# Patient Record
Sex: Female | Born: 1995 | Race: White | Hispanic: No | Marital: Single | Smoking: Never smoker
Health system: Southern US, Community
[De-identification: ages and names within clinical notes are randomized; demographics above are authoritative.]

## PROBLEM LIST (undated history)

## (undated) ENCOUNTER — Inpatient Hospital Stay (HOSPITAL_COMMUNITY): Payer: Self-pay

## (undated) DIAGNOSIS — R569 Unspecified convulsions: Secondary | ICD-10-CM

## (undated) DIAGNOSIS — O139 Gestational [pregnancy-induced] hypertension without significant proteinuria, unspecified trimester: Secondary | ICD-10-CM

## (undated) DIAGNOSIS — D332 Benign neoplasm of brain, unspecified: Secondary | ICD-10-CM

## (undated) DIAGNOSIS — D649 Anemia, unspecified: Secondary | ICD-10-CM

## (undated) DIAGNOSIS — J45909 Unspecified asthma, uncomplicated: Secondary | ICD-10-CM

## (undated) HISTORY — PX: TONSILLECTOMY: SUR1361

## (undated) HISTORY — DX: Gestational (pregnancy-induced) hypertension without significant proteinuria, unspecified trimester: O13.9

## (undated) HISTORY — PX: NASAL TURBINATE REDUCTION: SHX2072

## (undated) HISTORY — DX: Anemia, unspecified: D64.9

---

## 2014-05-28 ENCOUNTER — Inpatient Hospital Stay (HOSPITAL_COMMUNITY)
Admission: AD | Admit: 2014-05-28 | Discharge: 2014-05-28 | Disposition: A | Discharge status: Home / Self Care | Payer: Medicaid Other | Source: Ambulatory Visit | Attending: Obstetrics and Gynecology | Admitting: Obstetrics and Gynecology

## 2014-05-28 ENCOUNTER — Encounter (HOSPITAL_COMMUNITY): Payer: Self-pay

## 2014-05-28 ENCOUNTER — Inpatient Hospital Stay (HOSPITAL_COMMUNITY): Payer: Medicaid Other

## 2014-05-28 DIAGNOSIS — O4690 Antepartum hemorrhage, unspecified, unspecified trimester: Secondary | ICD-10-CM | POA: Insufficient documentation

## 2014-05-28 DIAGNOSIS — N939 Abnormal uterine and vaginal bleeding, unspecified: Secondary | ICD-10-CM

## 2014-05-28 DIAGNOSIS — N898 Other specified noninflammatory disorders of vagina: Secondary | ICD-10-CM

## 2014-05-28 HISTORY — DX: Unspecified convulsions: R56.9

## 2014-05-28 HISTORY — DX: Benign neoplasm of brain, unspecified: D33.2

## 2014-05-28 HISTORY — DX: Unspecified asthma, uncomplicated: J45.909

## 2014-05-28 LAB — CBC
HCT: 31.1 % — ABNORMAL LOW (ref 36.0–46.0)
Hemoglobin: 10.5 g/dL — ABNORMAL LOW (ref 12.0–15.0)
MCH: 28.5 pg (ref 26.0–34.0)
MCHC: 33.8 g/dL (ref 30.0–36.0)
MCV: 84.5 fL (ref 78.0–100.0)
Platelets: 337 10*3/uL (ref 150–400)
RBC: 3.68 MIL/uL — ABNORMAL LOW (ref 3.87–5.11)
RDW: 13.7 % (ref 11.5–15.5)
WBC: 9 10*3/uL (ref 4.0–10.5)

## 2014-05-28 LAB — TYPE AND SCREEN
ABO/RH(D): O POS
Antibody Screen: NEGATIVE

## 2014-05-28 LAB — ABO/RH: ABO/RH(D): O POS

## 2014-05-28 NOTE — MAU Note (Signed)
Pt reports large amount of vaginal bleeding immediately after having intercourse within the last hour. Denies abdominal pain. Positive fetal movement. Goes to [facility], Dr. Yolonda Kida. Denies complications with pregnancy other than proteinuria.

## 2014-05-28 NOTE — Discharge Instructions (Signed)
Vaginal Bleeding During Pregnancy  A small amount of bleeding from the vagina can happen anytime during pregnancy. It usually stops on its own. However, some bleeding can be serious. Be sure to tell your doctor about all vaginal bleeding.  HOME CARE   Get plenty of rest and sleep.   Stay in bed and only get up to go to the bathroom as told by your doctor.   Write down the number of pads you use each day. Note how soaked they are.   Do not use tampons. Do not clean the vagina with a stream of water (douche).   Do not have sex (intercourse) or put anything into your vagina. Have this approved by your doctor.   Save any tissue that comes from your vagina. Show it to your doctor.   Only take medicine as told by your doctor.   Follow your doctor's advice about lifting, driving, and physical activity.  GET HELP RIGHT AWAY IF:    You feel your baby move less or not at all.   You pass out (faint) while going to the bathroom.   You have more bleeding.   You start to have contractions.   You have severe cramps in your stomach, back, or belly (abdomen).   You are leaking fluid or have a gush of fluid from your vagina.   You become lightheaded or weak.   You have chills.   You have clumps of tissue or blood clots coming from your vagina.   You have a fever.  MAKE SURE YOU:    Understand these instructions.   Will watch your condition.   Will get help right away if you are not doing well or get worse.  Document Released: 09/14/2008 Document Revised: 11/22/2012 Document Reviewed: 09/25/2012  ExitCare Patient Information 2014 [employer].
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
## 2014-05-29 NOTE — MAU Provider Note (Signed)
Attestation of Attending Supervision of Advanced Practitioner (CNM/NP): Evaluation and management procedures were performed by the Advanced Practitioner under my supervision and collaboration.  I have reviewed the Advanced Practitioner's note and chart, and I agree with the management and plan.  Gaylin Bulthuis 05/29/2014 3:57 PM

## 2014-06-24 DIAGNOSIS — O1493 Unspecified pre-eclampsia, third trimester: Secondary | ICD-10-CM

## 2014-10-21 ENCOUNTER — Encounter (HOSPITAL_COMMUNITY): Payer: Self-pay

## 2015-04-02 ENCOUNTER — Encounter (HOSPITAL_COMMUNITY): Payer: Self-pay | Admitting: *Deleted

## 2017-06-19 ENCOUNTER — Emergency Department (HOSPITAL_COMMUNITY)
Admission: EM | Admit: 2017-06-19 | Discharge: 2017-06-20 | Disposition: A | Discharge status: Home / Self Care | Payer: Medicaid Other | Attending: Emergency Medicine | Admitting: Emergency Medicine

## 2017-06-19 ENCOUNTER — Encounter (HOSPITAL_COMMUNITY): Payer: Self-pay | Admitting: *Deleted

## 2017-06-19 DIAGNOSIS — Z79899 Other long term (current) drug therapy: Secondary | ICD-10-CM | POA: Insufficient documentation

## 2017-06-19 DIAGNOSIS — R109 Unspecified abdominal pain: Secondary | ICD-10-CM | POA: Diagnosis present

## 2017-06-19 DIAGNOSIS — O2341 Unspecified infection of urinary tract in pregnancy, first trimester: Secondary | ICD-10-CM

## 2017-06-19 DIAGNOSIS — Z3A01 Less than 8 weeks gestation of pregnancy: Secondary | ICD-10-CM | POA: Insufficient documentation

## 2017-06-19 DIAGNOSIS — J45909 Unspecified asthma, uncomplicated: Secondary | ICD-10-CM | POA: Diagnosis not present

## 2017-06-19 LAB — CBC WITH DIFFERENTIAL/PLATELET
Basophils Absolute: 0 10*3/uL (ref 0.0–0.1)
Basophils Relative: 0 %
Eosinophils Absolute: 0 10*3/uL (ref 0.0–0.7)
Eosinophils Relative: 0 %
HCT: 39.3 % (ref 36.0–46.0)
Hemoglobin: 13.2 g/dL (ref 12.0–15.0)
Lymphocytes Relative: 20 %
Lymphs Abs: 2 10*3/uL (ref 0.7–4.0)
MCH: 27.6 pg (ref 26.0–34.0)
MCHC: 33.6 g/dL (ref 30.0–36.0)
MCV: 82.2 fL (ref 78.0–100.0)
Monocytes Absolute: 0.5 10*3/uL (ref 0.1–1.0)
Monocytes Relative: 5 %
Neutro Abs: 7.5 10*3/uL (ref 1.7–7.7)
Neutrophils Relative %: 75 %
Platelets: 324 10*3/uL (ref 150–400)
RBC: 4.78 MIL/uL (ref 3.87–5.11)
RDW: 13.7 % (ref 11.5–15.5)
WBC: 9.9 10*3/uL (ref 4.0–10.5)

## 2017-06-19 LAB — URINALYSIS, ROUTINE W REFLEX MICROSCOPIC
Bilirubin Urine: NEGATIVE
Glucose, UA: NEGATIVE mg/dL
Ketones, ur: NEGATIVE mg/dL
Nitrite: POSITIVE — AB
Protein, ur: 30 mg/dL — AB
Specific Gravity, Urine: 1.026 (ref 1.005–1.030)
pH: 5 (ref 5.0–8.0)

## 2017-06-19 LAB — COMPREHENSIVE METABOLIC PANEL
ALT: 14 U/L (ref 14–54)
AST: 14 U/L — ABNORMAL LOW (ref 15–41)
Albumin: 4.2 g/dL (ref 3.5–5.0)
Alkaline Phosphatase: 63 U/L (ref 38–126)
Anion gap: 8 (ref 5–15)
BUN: 9 mg/dL (ref 6–20)
CO2: 28 mmol/L (ref 22–32)
Calcium: 9.5 mg/dL (ref 8.9–10.3)
Chloride: 104 mmol/L (ref 101–111)
Creatinine, Ser: 0.84 mg/dL (ref 0.44–1.00)
GFR calc Af Amer: >60 mL/min (ref >60)
GFR calc non Af Amer: >60 mL/min (ref >60)
Glucose, Bld: 90 mg/dL (ref 65–99)
Potassium: 3.6 mmol/L (ref 3.5–5.1)
Sodium: 140 mmol/L (ref 135–145)
Total Bilirubin: 1 mg/dL (ref 0.3–1.2)
Total Protein: 7.4 g/dL (ref 6.5–8.1)
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
## 2017-06-19 NOTE — ED Notes (Signed)
Pt admits to taking 0.5 mg of xanax prior to arrival in er.

## 2017-06-20 ENCOUNTER — Other Ambulatory Visit: Payer: Self-pay | Admitting: Obstetrics & Gynecology

## 2017-06-20 ENCOUNTER — Other Ambulatory Visit (INDEPENDENT_AMBULATORY_CARE_PROVIDER_SITE_OTHER): Payer: Medicaid Other

## 2017-06-20 ENCOUNTER — Ambulatory Visit (INDEPENDENT_AMBULATORY_CARE_PROVIDER_SITE_OTHER): Payer: Medicaid Other | Admitting: Obstetrics & Gynecology

## 2017-06-20 ENCOUNTER — Encounter: Payer: Self-pay | Admitting: Obstetrics & Gynecology

## 2017-06-20 VITALS — BP 120/82 | HR 106 | Ht 70.0 in | Wt 264.0 lb

## 2017-06-20 DIAGNOSIS — O3680X0 Pregnancy with inconclusive fetal viability, not applicable or unspecified: Secondary | ICD-10-CM | POA: Diagnosis not present

## 2017-06-20 DIAGNOSIS — O9A211 Injury, poisoning and certain other consequences of external causes complicating pregnancy, first trimester: Secondary | ICD-10-CM | POA: Diagnosis not present

## 2017-06-20 LAB — HCG, QUANTITATIVE, PREGNANCY: hCG, Beta Chain, Quant, S: 6159 m[IU]/mL — ABNORMAL HIGH (ref <5)

## 2017-06-20 MED ORDER — CEPHALEXIN 500 MG PO CAPS: 500.0000 mg | ORAL_CAPSULE | Freq: Four times a day (QID) | ORAL | 0 refills | Status: DC

## 2017-06-20 MED ORDER — CEPHALEXIN 500 MG PO CAPS
500.0000 mg | ORAL_CAPSULE | Freq: Once | ORAL | Status: AC
  Administered 2017-06-20: 500 mg via ORAL
  Filled 2017-06-20: qty 1

## 2017-06-20 MED ORDER — ACETAMINOPHEN 500 MG PO TABS
500.0000 mg | ORAL_TABLET | Freq: Once | ORAL | Status: AC
  Administered 2017-06-20: 500 mg via ORAL
  Filled 2017-06-20: qty 1

## 2017-06-20 NOTE — Progress Notes (Signed)
[place] 5+5 GS w/ys,no fetal pole seen,normal ovaries bilat,GS 1.08 cm

## 2017-06-20 NOTE — ED Notes (Signed)
Reiterated to only take tylenol for pain during pregnancy.  Pt states understanding of care given and follow up instructions.  Pt a/o ambulated from ED with steady gait
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
## 2017-06-20 NOTE — Discharge Instructions (Signed)
You can only take tylenol for pain or fever during pregnancy.  Drink plenty of water and take the antibiotic as directed.  Call [facility] on Monday to arrange a follow-up appt for repeat blood test to check your hormone level.  Return to ER for any worsening symptoms such as vaginal bleeding or abdominal pain

## 2017-06-22 LAB — URINE CULTURE: Culture: >=100000 — AB

## 2017-06-23 ENCOUNTER — Telehealth: Payer: Self-pay | Admitting: *Deleted

## 2017-06-23 NOTE — Telephone Encounter (Signed)
Post ED Visit - Positive Culture Follow-up  Culture report reviewed by antimicrobial stewardship pharmacist:  []  Elenor Quinones, Pharm.D. []  Heide Guile, Pharm.D., BCPS AQ-ID [x]  Parks Neptune, Pharm.D., BCPS []  Alycia Rossetti, Pharm.D., BCPS []  [place], Pharm.D., BCPS, AAHIVP []  Legrand Como, Pharm.D., BCPS, AAHIVP []  Salome Arnt, PharmD, BCPS []  Dimitri Ped, PharmD, BCPS []  Vincenza Hews, PharmD, BCPS  Positive urine culture Treated with Cephalexin, organism sensitive to the same and no further patient follow-up is required at this time.  Harlon Flor [facility] 06/23/2017, 11:03 AM

## 2017-06-28 ENCOUNTER — Encounter: Payer: Self-pay | Admitting: Adult Health

## 2017-07-01 ENCOUNTER — Encounter: Payer: Self-pay | Admitting: Emergency Medicine

## 2017-07-01 ENCOUNTER — Emergency Department (HOSPITAL_COMMUNITY)
Admission: EM | Admit: 2017-07-01 | Discharge: 2017-07-01 | Disposition: A | Discharge status: Home / Self Care | Payer: Medicaid Other | Attending: Emergency Medicine | Admitting: Emergency Medicine

## 2017-07-01 DIAGNOSIS — K029 Dental caries, unspecified: Secondary | ICD-10-CM | POA: Insufficient documentation

## 2017-07-01 DIAGNOSIS — K0889 Other specified disorders of teeth and supporting structures: Secondary | ICD-10-CM | POA: Diagnosis present

## 2017-07-01 MED ORDER — PENICILLIN V POTASSIUM 500 MG PO TABS: 500.0000 mg | ORAL_TABLET | Freq: Four times a day (QID) | ORAL | 0 refills | Status: AC

## 2017-07-01 NOTE — ED Provider Notes (Signed)
[facility] DEPT Provider Note   CSN: [id number] Arrival date & time: 07/01/17  1543     History   Chief Complaint Chief Complaint  Patient presents with  . Dental Pain    HPI Madison Daugherty is a 21 y.o. female.  Patient complains of swelling in her right cheek with pain in her upper right teeth   The history is provided by the patient. No language interpreter was used.  Dental Pain   This is a new problem. The current episode started more than 2 days ago. The problem occurs constantly. The problem has not changed since onset.The pain is at a severity of 6/10. The pain is moderate. She has tried nothing for the symptoms. The treatment provided no relief.    Past Medical History:  Diagnosis Date  . Asthma   . Brain tumor (benign) ([place])   . Seizures ([place])    r/t brain tumor when younger, last sz when 21 yrs old    There are no active problems to display for this patient.   Past Surgical History:  Procedure Laterality Date  . NASAL TURBINATE REDUCTION    . TONSILLECTOMY      OB History    Gravida Para Term Preterm AB Living   3 1 0 1 0 1   SAB TAB Ectopic Multiple Live Births   0 0 0 0         Home Medications    Prior to Admission medications   Medication Sig Start Date End Date Taking? Authorizing Provider  albuterol (PROVENTIL HFA;VENTOLIN HFA) 108 (90 BASE) MCG/ACT inhaler Inhale 2 puffs into the lungs every 6 (six) hours as needed for wheezing or shortness of breath.    [provider]  ALPRAZolam Duanne Moron) 0.5 MG tablet Take 0.5 mg by mouth once.    [provider]  aspirin-acetaminophen-caffeine (EXCEDRIN EXTRA STRENGTH) [phone] MG tablet Take 1-2 tablets by mouth every 6 (six) hours as needed for headache or migraine.    [provider]  cephALEXin (KEFLEX) 500 MG capsule Take 1 capsule (500 mg total) by mouth 4 (four) times daily. 06/20/17   Triplett, Tammy, PA-C  penicillin v potassium (VEETID) 500 MG tablet Take 1 tablet  (500 mg total) by mouth 4 (four) times daily. 07/01/17 07/08/17  Milton Ferguson, MD    Family History No family history on file.  Social History Social History  Substance Use Topics  . Smoking status: Never Smoker  . Smokeless tobacco: Never Used  . Alcohol use No     Allergies   Patient has no known allergies.   Review of Systems Review of Systems  Constitutional: Negative for appetite change and fatigue.  HENT: Negative for congestion, ear discharge and sinus pressure.        Toothache  Eyes: Negative for discharge.  Respiratory: Negative for cough.   Cardiovascular: Negative for chest pain.  Gastrointestinal: Negative for abdominal pain and diarrhea.  Genitourinary: Negative for frequency and hematuria.  Musculoskeletal: Negative for back pain.  Skin: Negative for rash.  Neurological: Negative for seizures and headaches.  Psychiatric/Behavioral: Negative for hallucinations.     Physical Exam Updated Vital Signs BP 117/71 (BP Location: Right Arm)   Pulse 96   Temp 98.5 F (36.9 C) (Oral)   Resp 16   Ht 5\' 10"  (1.778 m)   Wt 113.4 kg (250 lb)   LMP 06/11/2017   SpO2 100%   BMI 35.87 kg/m   Physical Exam  Constitutional: She is  oriented to person, place, and time. She appears well-developed.  HENT:  Head: Normocephalic.  Swollen right cheek with tenderness to right upper molar  Eyes: Conjunctivae are normal.  Neck: No tracheal deviation present.  Cardiovascular:  No murmur heard. Musculoskeletal: Normal range of motion.  Neurological: She is oriented to person, place, and time.  Skin: Skin is warm.  Psychiatric: She has a normal mood and affect.     ED Treatments / Results  Labs (all labs ordered are listed, but only abnormal results are displayed) Labs Reviewed - No data to display  EKG  EKG Interpretation None       Radiology No results found.  Procedures Procedures (including critical care time)  Medications Ordered in  ED Medications - No data to display   Initial Impression / Assessment and Plan / ED Course  I have reviewed the triage vital signs and the nursing notes.  Pertinent labs & imaging results that were available during my care of the patient were reviewed by me and considered in my medical decision making (see chart for details).     Patient with abscessed tooth. Patient will be placed on penicillin will follow-up with dentist  Final Clinical Impressions(s) / ED Diagnoses   Final diagnoses:  Dental caries    New Prescriptions New Prescriptions   PENICILLIN V POTASSIUM (VEETID) 500 MG TABLET    Take 1 tablet (500 mg total) by mouth 4 (four) times daily.     Milton Ferguson, MD 07/01/17 1810

## 2017-07-01 NOTE — ED Triage Notes (Signed)
Facial swelling on right side, states same thing happened 2 weeks on left side and it went away. Cavity on right upper, pain with chewing

## 2017-07-01 NOTE — Discharge Instructions (Signed)
Take tylenol for pain and follow up with your dentist next week

## 2017-07-05 ENCOUNTER — Ambulatory Visit: Payer: Medicaid Other | Admitting: *Deleted

## 2017-07-05 ENCOUNTER — Other Ambulatory Visit: Payer: Self-pay | Admitting: Obstetrics & Gynecology

## 2017-07-05 ENCOUNTER — Ambulatory Visit (INDEPENDENT_AMBULATORY_CARE_PROVIDER_SITE_OTHER): Payer: Medicaid Other | Admitting: Advanced Practice Midwife

## 2017-07-05 ENCOUNTER — Ambulatory Visit (INDEPENDENT_AMBULATORY_CARE_PROVIDER_SITE_OTHER): Payer: Medicaid Other

## 2017-07-05 ENCOUNTER — Encounter: Payer: Self-pay | Admitting: Advanced Practice Midwife

## 2017-07-05 ENCOUNTER — Other Ambulatory Visit (HOSPITAL_COMMUNITY)
Admission: RE | Admit: 2017-07-05 | Discharge: 2017-07-05 | Disposition: A | Discharge status: Home / Self Care | Payer: Medicaid Other | Source: Ambulatory Visit | Attending: Advanced Practice Midwife | Admitting: Advanced Practice Midwife

## 2017-07-05 VITALS — BP 118/80 | HR 84 | Wt 270.0 lb

## 2017-07-05 DIAGNOSIS — Z124 Encounter for screening for malignant neoplasm of cervix: Secondary | ICD-10-CM | POA: Insufficient documentation

## 2017-07-05 DIAGNOSIS — Z8759 Personal history of other complications of pregnancy, childbirth and the puerperium: Secondary | ICD-10-CM

## 2017-07-05 DIAGNOSIS — O09899 Supervision of other high risk pregnancies, unspecified trimester: Secondary | ICD-10-CM

## 2017-07-05 DIAGNOSIS — Z1389 Encounter for screening for other disorder: Secondary | ICD-10-CM | POA: Diagnosis present

## 2017-07-05 DIAGNOSIS — O3680X0 Pregnancy with inconclusive fetal viability, not applicable or unspecified: Secondary | ICD-10-CM

## 2017-07-05 DIAGNOSIS — O09299 Supervision of pregnancy with other poor reproductive or obstetric history, unspecified trimester: Secondary | ICD-10-CM | POA: Insufficient documentation

## 2017-07-05 DIAGNOSIS — O09219 Supervision of pregnancy with history of pre-term labor, unspecified trimester: Secondary | ICD-10-CM

## 2017-07-05 DIAGNOSIS — O9A211 Injury, poisoning and certain other consequences of external causes complicating pregnancy, first trimester: Secondary | ICD-10-CM

## 2017-07-05 DIAGNOSIS — Z3A01 Less than 8 weeks gestation of pregnancy: Secondary | ICD-10-CM | POA: Diagnosis not present

## 2017-07-05 DIAGNOSIS — Z3490 Encounter for supervision of normal pregnancy, unspecified, unspecified trimester: Secondary | ICD-10-CM | POA: Insufficient documentation

## 2017-07-05 DIAGNOSIS — O09891 Supervision of other high risk pregnancies, first trimester: Secondary | ICD-10-CM | POA: Diagnosis not present

## 2017-07-05 DIAGNOSIS — Z3481 Encounter for supervision of other normal pregnancy, first trimester: Secondary | ICD-10-CM

## 2017-07-05 DIAGNOSIS — Z3682 Encounter for antenatal screening for nuchal translucency: Secondary | ICD-10-CM

## 2017-07-05 DIAGNOSIS — Z331 Pregnant state, incidental: Secondary | ICD-10-CM

## 2017-07-05 LAB — POCT URINALYSIS DIPSTICK
Blood, UA: NEGATIVE
Glucose, UA: NEGATIVE
Ketones, UA: NEGATIVE
Leukocytes, UA: NEGATIVE
Nitrite, UA: NEGATIVE
Protein, UA: NEGATIVE

## 2017-07-05 NOTE — Patient Instructions (Signed)
 First Trimester of Pregnancy The first trimester of pregnancy is from week 1 until the end of week 12 (months 1 through 3). A week after a sperm fertilizes an egg, the egg will implant on the wall of the uterus. This embryo will begin to develop into a baby. Genes from you and your partner are forming the baby. The female genes determine whether the baby is a boy or a girl. At 6-8 weeks, the eyes and face are formed, and the heartbeat can be seen on ultrasound. At the end of 12 weeks, all the baby's organs are formed.  Now that you are pregnant, you will want to do everything you can to have a healthy baby. Two of the most important things are to get good prenatal care and to follow your health care provider's instructions. Prenatal care is all the medical care you receive before the baby's birth. This care will help prevent, find, and treat any problems during the pregnancy and childbirth. BODY CHANGES Your body goes through many changes during pregnancy. The changes vary from woman to woman.   You may gain or lose a couple of pounds at first.  You may feel sick to your stomach (nauseous) and throw up (vomit). If the vomiting is uncontrollable, call your health care provider.  You may tire easily.  You may develop headaches that can be relieved by medicines approved by your health care provider.  You may urinate more often. Painful urination may mean you have a bladder infection.  You may develop heartburn as a result of your pregnancy.  You may develop constipation because certain hormones are causing the muscles that push waste through your intestines to slow down.  You may develop hemorrhoids or swollen, bulging veins (varicose veins).  Your breasts may begin to grow larger and become tender. Your nipples may stick out more, and the tissue that surrounds them (areola) may become darker.  Your gums may bleed and may be sensitive to brushing and flossing.  Dark spots or blotches  (chloasma, mask of pregnancy) may develop on your face. This will likely fade after the baby is born.  Your menstrual periods will stop.  You may have a loss of appetite.  You may develop cravings for certain kinds of food.  You may have changes in your emotions from day to day, such as being excited to be pregnant or being concerned that something may go wrong with the pregnancy and baby.  You may have more vivid and strange dreams.  You may have changes in your hair. These can include thickening of your hair, rapid growth, and changes in texture. Some women also have hair loss during or after pregnancy, or hair that feels dry or thin. Your hair will most likely return to normal after your baby is born. WHAT TO EXPECT AT YOUR PRENATAL VISITS During a routine prenatal visit:  You will be weighed to make sure you and the baby are growing normally.  Your blood pressure will be taken.  Your abdomen will be measured to track your baby's growth.  The fetal heartbeat will be listened to starting around week 10 or 12 of your pregnancy.  Test results from any previous visits will be discussed. Your health care provider may ask you:  How you are feeling.  If you are feeling the baby move.  If you have had any abnormal symptoms, such as leaking fluid, bleeding, severe headaches, or abdominal cramping.  If you have any questions. Other   tests that may be performed during your first trimester include:  Blood tests to find your blood type and to check for the presence of any previous infections. They will also be used to check for low iron levels (anemia) and Rh antibodies. Later in the pregnancy, blood tests for diabetes will be done along with other tests if problems develop.  Urine tests to check for infections, diabetes, or protein in the urine.  An ultrasound to confirm the proper growth and development of the baby.  An amniocentesis to check for possible genetic problems.  Fetal  screens for spina bifida and Down syndrome.  You may need other tests to make sure you and the baby are doing well. HOME CARE INSTRUCTIONS  Medicines  Follow your health care provider's instructions regarding medicine use. Specific medicines may be either safe or unsafe to take during pregnancy.  Take your prenatal vitamins as directed.  If you develop constipation, try taking a stool softener if your health care provider approves. Diet  Eat regular, well-balanced meals. Choose a variety of foods, such as meat or vegetable-based protein, fish, milk and low-fat dairy products, vegetables, fruits, and whole grain breads and cereals. Your health care provider will help you determine the amount of weight gain that is right for you.  Avoid raw meat and uncooked cheese. These carry germs that can cause birth defects in the baby.  Eating four or five small meals rather than three large meals a day may help relieve nausea and vomiting. If you start to feel nauseous, eating a few soda crackers can be helpful. Drinking liquids between meals instead of during meals also seems to help nausea and vomiting.  If you develop constipation, eat more high-fiber foods, such as fresh vegetables or fruit and whole grains. Drink enough fluids to keep your urine clear or pale yellow. Activity and Exercise  Exercise only as directed by your health care provider. Exercising will help you:  Control your weight.  Stay in shape.  Be prepared for labor and delivery.  Experiencing pain or cramping in the lower abdomen or low back is a good sign that you should stop exercising. Check with your health care provider before continuing normal exercises.  Try to avoid standing for long periods of time. Move your legs often if you must stand in one place for a long time.  Avoid heavy lifting.  Wear low-heeled shoes, and practice good posture.  You may continue to have sex unless your health care provider directs you  otherwise. Relief of Pain or Discomfort  Wear a good support bra for breast tenderness.   Take warm sitz baths to soothe any pain or discomfort caused by hemorrhoids. Use hemorrhoid cream if your health care provider approves.   Rest with your legs elevated if you have leg cramps or low back pain.  If you develop varicose veins in your legs, wear support hose. Elevate your feet for 15 minutes, 3-4 times a day. Limit salt in your diet. Prenatal Care  Schedule your prenatal visits by the twelfth week of pregnancy. They are usually scheduled monthly at first, then more often in the last 2 months before delivery.  Write down your questions. Take them to your prenatal visits.  Keep all your prenatal visits as directed by your health care provider. Safety  Wear your seat belt at all times when driving.  Make a list of emergency phone numbers, including numbers for family, friends, the hospital, and police and fire departments. General   Tips  Ask your health care provider for a referral to a local prenatal education class. Begin classes no later than at the beginning of month 6 of your pregnancy.  Ask for help if you have counseling or nutritional needs during pregnancy. Your health care provider can offer advice or refer you to specialists for help with various needs.  Do not use hot tubs, steam rooms, or saunas.  Do not douche or use tampons or scented sanitary pads.  Do not cross your legs for long periods of time.  Avoid cat litter boxes and soil used by cats. These carry germs that can cause birth defects in the baby and possibly loss of the fetus by miscarriage or stillbirth.  Avoid all smoking, herbs, alcohol, and medicines not prescribed by your health care provider. Chemicals in these affect the formation and growth of the baby.  Schedule a dentist appointment. At home, brush your teeth with a soft toothbrush and be gentle when you floss. SEEK MEDICAL CARE IF:   You have  dizziness.  You have mild pelvic cramps, pelvic pressure, or nagging pain in the abdominal area.  You have persistent nausea, vomiting, or diarrhea.  You have a bad smelling vaginal discharge.  You have pain with urination.  You notice increased swelling in your face, hands, legs, or ankles. SEEK IMMEDIATE MEDICAL CARE IF:   You have a fever.  You are leaking fluid from your vagina.  You have spotting or bleeding from your vagina.  You have severe abdominal cramping or pain.  You have rapid weight gain or loss.  You vomit blood or material that looks like coffee grounds.  You are exposed to German measles and have never had them.  You are exposed to fifth disease or chickenpox.  You develop a severe headache.  You have shortness of breath.  You have any kind of trauma, such as from a fall or a car accident. Document Released: 11/30/2001 Document Revised: 04/22/2014 Document Reviewed: 10/16/2013 Exit[employer] Patient Information 2015 [employer]. This information is not intended to replace advice given to you by your health care provider. Make sure you discuss any questions you have with your health care provider.   Nausea & Vomiting  Have saltine crackers or pretzels by your bed and eat a few bites before you raise your head out of bed in the morning  Eat small frequent meals throughout the day instead of large meals  Drink plenty of fluids throughout the day to stay hydrated, just don't drink a lot of fluids with your meals.  This can make your stomach fill up faster making you feel sick  Do not brush your teeth right after you eat  Products with real ginger are good for nausea, like ginger ale and ginger hard candy Make sure it says made with real ginger!  Sucking on sour candy like lemon heads is also good for nausea  If your prenatal vitamins make you nauseated, take them at night so you will sleep through the nausea  Sea Bands  If you feel like you need  medicine for the nausea & vomiting please let us know  If you are unable to keep any fluids or food down please let us know   Constipation  Drink plenty of fluid, preferably water, throughout the day  Eat foods high in fiber such as fruits, vegetables, and grains  Exercise, such as walking, is a good way to keep your bowels regular  Drink warm fluids, especially warm   prune juice, or decaf coffee  Eat a 1/2 cup of real oatmeal (not instant), 1/2 cup applesauce, and 1/2-1 cup warm prune juice every day  If needed, you may take Colace (docusate sodium) stool softener once or twice a day to help keep the stool soft. If you are pregnant, wait until you are out of your first trimester (12-14 weeks of pregnancy)  If you still are having problems with constipation, you may take Miralax once daily as needed to help keep your bowels regular.  If you are pregnant, wait until you are out of your first trimester (12-14 weeks of pregnancy)  Safe Medications in Pregnancy   Acne: Benzoyl Peroxide Salicylic Acid  Backache/Headache: Tylenol: 2 regular strength every 4 hours OR              2 Extra strength every 6 hours  Colds/Coughs/Allergies: Benadryl (alcohol free) 25 mg every 6 hours as needed Breath right strips Claritin Cepacol throat lozenges Chloraseptic throat spray Cold-Eeze- up to three times per day Cough drops, alcohol free Flonase (by prescription only) Guaifenesin Mucinex Robitussin DM (plain only, alcohol free) Saline nasal spray/drops Sudafed (pseudoephedrine) & Actifed ** use only after [redacted] weeks gestation and if you do not have high blood pressure Tylenol Vicks Vaporub Zinc lozenges Zyrtec   Constipation: Colace Ducolax suppositories Fleet enema Glycerin suppositories Metamucil Milk of magnesia Miralax Senokot Smooth move tea  Diarrhea: Kaopectate Imodium A-D  *NO pepto Bismol  Hemorrhoids: Anusol Anusol HC Preparation  H Tucks  Indigestion: Tums Maalox Mylanta Zantac  Pepcid  Insomnia: Benadryl (alcohol free) 25mg every 6 hours as needed Tylenol PM Unisom, no Gelcaps  Leg Cramps: Tums MagGel  Nausea/Vomiting:  Bonine Dramamine Emetrol Ginger extract Sea bands Meclizine  Nausea medication to take during pregnancy:  Unisom (doxylamine succinate 25 mg tablets) Take one tablet daily at bedtime. If symptoms are not adequately controlled, the dose can be increased to a maximum recommended dose of two tablets daily (1/2 tablet in the morning, 1/2 tablet mid-afternoon and one at bedtime). Vitamin B6 100mg tablets. Take one tablet twice a day (up to 200 mg per day).  Skin Rashes: Aveeno products Benadryl cream or 25mg every 6 hours as needed Calamine Lotion 1% cortisone cream  Yeast infection: Gyne-lotrimin 7 Monistat 7   **If taking multiple medications, please check labels to avoid duplicating the same active ingredients **take medication as directed on the label ** Do not exceed 4000 mg of tylenol in 24 hours **Do not take medications that contain aspirin or ibuprofen      

## 2017-07-05 NOTE — Progress Notes (Signed)
[place] 7+6 wks,single IUP w/ys,pos fht 155 bpm,crl 15.4 mm,subchorionic hemorrhage 2.2 x 2.1 x 1.3 cm,EDD 02/15/2018

## 2017-07-05 NOTE — Progress Notes (Signed)
  Subjective:    Madison Daugherty is a [id number] [redacted]w[redacted]d being seen today for her first obstetrical visit.  Her obstetrical history is significant for pre-eclampsia and PTD d/t PPROM.  Pregnancy history fully reviewed.  Patient reports no complaints.  Vitals:   07/05/17 1507  BP: 118/80  Pulse: 84  Weight: 270 lb (122.5 kg)    HISTORY: OB History  Gravida Para Term Preterm AB Living  2 1 0 1 0 1  SAB TAB Ectopic Multiple Live Births  0 0 0 0 1    # Outcome Date GA Lbr Len/2nd Weight Sex Delivery Anes PTL Lv  2 Current           1 Preterm 06/24/14 [redacted]w[redacted]d  6 lb 10 oz (3.005 kg) M Vag-Spont EPI N LIV     Complications: Pre-eclampsia in third trimester     Past Medical History:  Diagnosis Date  . Anemia   . Asthma   . Brain tumor (benign) ([place])   . Pregnancy induced hypertension   . Seizures ([place])    r/t brain tumor when younger, last sz when 21 yrs old   Past Surgical History:  Procedure Laterality Date  . NASAL TURBINATE REDUCTION    . TONSILLECTOMY     Family History  Problem Relation Age of Onset  . Heart murmur Mother   . Heart disease Maternal Grandmother   . Heart attack Maternal Grandmother   . Cerebral aneurysm Maternal Grandfather      Exam                                      System:     Skin: normal coloration and turgor, no rashes    Neurologic: oriented, normal, normal mood   Extremities: normal strength, tone, and muscle mass   HEENT PERRLA   Mouth/Teeth mucous membranes moist, normal dentition   Neck supple and no masses   Cardiovascular: regular rate and rhythm   Respiratory:  appears well, vitals normal, no respiratory distress, acyanotic   Abdomen: soft, non-tender;  FHR: 156      [place] 7+6 wks,single IUP w/ys,pos fht 155 bpm,crl 15.4 mm,subchorionic hemorrhage 2.2 x 2.1 x 1.3 cm  Still on Keflex for UTI 7/1 "terrible at taking pills".    Assessment:    Pregnancy: G2P0101 Patient Active Problem List   Diagnosis Date Noted  .  Supervision of normal pregnancy 07/05/2017  . History of pregnancy induced hypertension 07/05/2017        Plan:     Initial labs drawn. Continue prenatal vitamins  Offered 17p--will probably accept . Will let us know next visit ASA 81mg  >12 weeks Problem list reviewed and updated  Reviewed n/v relief measures and warning s/s to report  Reviewed recommended weight gain based on pre-gravid BMI  Encouraged well-balanced diet Genetic Screening discussed Integrated Screen: requested.  Ultrasound discussed; fetal survey: requested.  Return in about 4 weeks (around 08/02/2017) for US:NT+1st IT, LROB.  CRESENZO-DISHMAN,Oneika Simonian 07/05/2017

## 2017-07-07 ENCOUNTER — Other Ambulatory Visit: Payer: Self-pay | Admitting: Advanced Practice Midwife

## 2017-07-07 ENCOUNTER — Encounter: Payer: Self-pay | Admitting: Advanced Practice Midwife

## 2017-07-07 LAB — CBC
Hematocrit: 37.9 % (ref 34.0–46.6)
Hemoglobin: 12.9 g/dL (ref 11.1–15.9)
MCH: 28.2 pg (ref 26.6–33.0)
MCHC: 34 g/dL (ref 31.5–35.7)
MCV: 83 fL (ref 79–97)
Platelets: 320 10*3/uL (ref 150–379)
RBC: 4.57 x10E6/uL (ref 3.77–5.28)
RDW: 14 % (ref 12.3–15.4)
WBC: 7.4 10*3/uL (ref 3.4–10.8)

## 2017-07-07 LAB — PMP SCREEN PROFILE (10S), URINE
Amphetamine Scrn, Ur: NEGATIVE ng/mL
BARBITURATE SCREEN URINE: NEGATIVE ng/mL
BENZODIAZEPINE SCREEN, URINE: NEGATIVE ng/mL
CANNABINOIDS UR QL SCN: POSITIVE ng/mL — AB
Cocaine (Metab) Scrn, Ur: NEGATIVE ng/mL
Creatinine(Crt), U: 150.9 mg/dL (ref 20.0–300.0)
Methadone Screen, Urine: NEGATIVE ng/mL
OXYCODONE+OXYMORPHONE UR QL SCN: NEGATIVE ng/mL
Opiate Scrn, Ur: POSITIVE ng/mL — AB
Ph of Urine: 7.5 (ref 4.5–8.9)
Phencyclidine Qn, Ur: NEGATIVE ng/mL
Propoxyphene Scrn, Ur: NEGATIVE ng/mL

## 2017-07-07 LAB — URINALYSIS, ROUTINE W REFLEX MICROSCOPIC
Bilirubin, UA: NEGATIVE
Glucose, UA: NEGATIVE
Ketones, UA: NEGATIVE
Leukocytes, UA: NEGATIVE
Nitrite, UA: NEGATIVE
Protein, UA: NEGATIVE
RBC, UA: NEGATIVE
Specific Gravity, UA: 1.026 (ref 1.005–1.030)
Urobilinogen, Ur: 1 mg/dL (ref 0.2–1.0)
pH, UA: 7 (ref 5.0–7.5)

## 2017-07-07 LAB — HIV ANTIBODY (ROUTINE TESTING W REFLEX): HIV Screen 4th Generation wRfx: NONREACTIVE

## 2017-07-07 LAB — ANTIBODY SCREEN: Antibody Screen: NEGATIVE

## 2017-07-07 LAB — RPR: RPR Ser Ql: NONREACTIVE

## 2017-07-07 LAB — HEPATITIS B SURFACE ANTIGEN: Hepatitis B Surface Ag: NEGATIVE

## 2017-07-07 LAB — VARICELLA ZOSTER ANTIBODY, IGG: Varicella zoster IgG: 1017 index (ref >165)

## 2017-07-07 LAB — RUBELLA SCREEN: Rubella Antibodies, IGG: 6.76 index (ref >0.99)

## 2017-07-07 MED ORDER — SULFAMETHOXAZOLE-TRIMETHOPRIM 800-160 MG PO TABS: 1.0000 | ORAL_TABLET | Freq: Two times a day (BID) | ORAL | 0 refills | Status: AC

## 2017-07-07 NOTE — Progress Notes (Signed)
rx for septra for UTI (can't take keflex QID, kept forgetting)

## 2017-07-08 LAB — CYTOLOGY - PAP
Adequacy: ABSENT
Chlamydia: NEGATIVE
Diagnosis: NEGATIVE
Neisseria Gonorrhea: NEGATIVE

## 2017-07-08 LAB — URINE CULTURE

## 2017-08-02 ENCOUNTER — Encounter: Payer: Self-pay | Admitting: Obstetrics & Gynecology

## 2017-08-02 ENCOUNTER — Ambulatory Visit (INDEPENDENT_AMBULATORY_CARE_PROVIDER_SITE_OTHER): Payer: Medicaid Other

## 2017-08-02 ENCOUNTER — Ambulatory Visit (INDEPENDENT_AMBULATORY_CARE_PROVIDER_SITE_OTHER): Payer: Medicaid Other | Admitting: Obstetrics & Gynecology

## 2017-08-02 VITALS — BP 120/70 | HR 80 | Wt 272.0 lb

## 2017-08-02 DIAGNOSIS — Z331 Pregnant state, incidental: Secondary | ICD-10-CM

## 2017-08-02 DIAGNOSIS — Z3A11 11 weeks gestation of pregnancy: Secondary | ICD-10-CM

## 2017-08-02 DIAGNOSIS — O09219 Supervision of pregnancy with history of pre-term labor, unspecified trimester: Secondary | ICD-10-CM

## 2017-08-02 DIAGNOSIS — Z3682 Encounter for antenatal screening for nuchal translucency: Secondary | ICD-10-CM

## 2017-08-02 DIAGNOSIS — O09299 Supervision of pregnancy with other poor reproductive or obstetric history, unspecified trimester: Secondary | ICD-10-CM

## 2017-08-02 DIAGNOSIS — Z3481 Encounter for supervision of other normal pregnancy, first trimester: Secondary | ICD-10-CM

## 2017-08-02 DIAGNOSIS — Z1389 Encounter for screening for other disorder: Secondary | ICD-10-CM

## 2017-08-02 DIAGNOSIS — O09899 Supervision of other high risk pregnancies, unspecified trimester: Secondary | ICD-10-CM

## 2017-08-02 LAB — POCT URINALYSIS DIPSTICK
Blood, UA: NEGATIVE
Glucose, UA: NEGATIVE
Ketones, UA: NEGATIVE
Leukocytes, UA: NEGATIVE
Nitrite, UA: NEGATIVE

## 2017-08-02 MED ORDER — ASPIRIN 81 MG PO CHEW: 81.0000 mg | CHEWABLE_TABLET | Freq: Every day | ORAL | 11 refills | Status: AC

## 2017-08-02 NOTE — Progress Notes (Signed)
[id number] [redacted]w[redacted]d Estimated Date of Delivery: 02/15/18  Blood pressure 120/70, pulse 80, weight 272 lb (123.4 kg), last menstrual period 06/11/2017, unknown if currently breastfeeding.   BP weight and urine results all reviewed and noted.  Please refer to the obstetrical flow sheet for the fundal height and fetal heart rate documentation:  Patient reports good fetal movement, denies any bleeding and no rupture of membranes symptoms or regular contractions. Patient is without complaints. All questions were answered.  Orders Placed This Encounter  Procedures  . Maternal Screen, Integrated #1  . POCT urinalysis dipstick    Meds ordered this encounter  Medications  . aspirin 81 MG chewable tablet    Sig: Chew 1 tablet (81 mg total) by mouth daily.    Dispense:  30 tablet    Refill:  11    Begin at 14 weeks       Plan:  Continued routine obstetrical care, NT sonogram is normal today  Return in about 4 weeks (around 08/30/2017) for LROB.

## 2017-08-02 NOTE — Progress Notes (Signed)
[place] 11+6 wks,measurements c/w dates,normal ovaries bilat,fhr 164 bpm,NB present,NT 1 mm,subchorionic hemorrhage 2.4 x .8 x 3.1 cm

## 2017-08-09 LAB — MATERNAL SCREEN, INTEGRATED #1
Maternal Age at EDD: 22.8 yr
PAPP-A Value: 199.9 ng/mL

## 2017-08-19 ENCOUNTER — Telehealth: Payer: Self-pay | Admitting: *Deleted

## 2017-08-19 NOTE — Telephone Encounter (Signed)
Pt called stating that she had a pulled muscle and asked if Soma muscle relaxer was safe to take during pregnancy. I advised pt, that per Dr Glo Herring, it is safe to take. Pt verbalized understanding.

## 2017-08-30 ENCOUNTER — Ambulatory Visit (INDEPENDENT_AMBULATORY_CARE_PROVIDER_SITE_OTHER): Payer: Medicaid Other | Admitting: Advanced Practice Midwife

## 2017-08-30 ENCOUNTER — Encounter: Payer: Self-pay | Admitting: Advanced Practice Midwife

## 2017-08-30 VITALS — BP 110/80 | HR 80 | Wt 275.0 lb

## 2017-08-30 DIAGNOSIS — Z1389 Encounter for screening for other disorder: Secondary | ICD-10-CM

## 2017-08-30 DIAGNOSIS — Z1379 Encounter for other screening for genetic and chromosomal anomalies: Secondary | ICD-10-CM

## 2017-08-30 DIAGNOSIS — Z331 Pregnant state, incidental: Secondary | ICD-10-CM

## 2017-08-30 DIAGNOSIS — Z3A15 15 weeks gestation of pregnancy: Secondary | ICD-10-CM

## 2017-08-30 DIAGNOSIS — Z3482 Encounter for supervision of other normal pregnancy, second trimester: Secondary | ICD-10-CM

## 2017-08-30 DIAGNOSIS — Z363 Encounter for antenatal screening for malformations: Secondary | ICD-10-CM

## 2017-08-30 LAB — POCT URINALYSIS DIPSTICK
Blood, UA: NEGATIVE
Glucose, UA: NEGATIVE
Ketones, UA: NEGATIVE
Nitrite, UA: NEGATIVE
Protein, UA: NEGATIVE

## 2017-08-30 NOTE — Progress Notes (Signed)
[id number] [redacted]w[redacted]d Estimated Date of Delivery: 02/15/18  Blood pressure 110/80, pulse 80, weight 275 lb (124.7 kg), last menstrual period 06/11/2017, unknown if currently breastfeeding.   BP weight and urine results all reviewed and noted.  Please refer to the obstetrical flow sheet for the fundal height and fetal heart rate documentation:  Patient denies any bleeding and no rupture of membranes symptoms or regular contractions. Patient is without complaints. Started ASA.  Still unsure about 17 p (really hasn't thought about it).    All questions were answered.  Orders Placed This Encounter  Procedures  . US OB Comp + 14 Wk  . INTEGRATED 2  . POCT urinalysis dipstick    Plan:  Continued routine obstetrical care, let us know if she wants [place] to order 17p  Return in about 3 weeks (around 09/20/2017) for [place], [id number].

## 2017-08-31 LAB — INTEGRATED 1
Crown Rump Length: 47.2 mm
Gest. Age on Collection Date: 11.6 weeks
Maternal Age at EDD: 22.5 yr
Nuchal Translucency (NT): 1 mm
Number of Fetuses: 1
PAPP-A Value: 199.9 ng/mL
Weight: 272 [lb_av]

## 2017-09-01 ENCOUNTER — Telehealth: Payer: Self-pay | Admitting: *Deleted

## 2017-09-01 LAB — INTEGRATED 2
AFP MoM: 0.72
Alpha-Fetoprotein: 12.5 ng/mL
Crown Rump Length: 47.2 mm
DIA MoM: 0.65
DIA Value: 81.1 pg/mL
Estriol, Unconjugated: 0.86 ng/mL
Gest. Age on Collection Date: 11.6 weeks
Gestational Age: 15.6 weeks
Maternal Age at EDD: 22.5 yr
Nuchal Translucency (NT): 1 mm
Nuchal Translucency MoM: 0.76
Number of Fetuses: 1
PAPP-A MoM: 0.66
PAPP-A Value: 199.9 ng/mL
Test Results:: NEGATIVE
Weight: 272 [lb_av]
Weight: 272 [lb_av]
hCG MoM: 1.23
hCG Value: 29.2 IU/mL
uE3 MoM: 1.51

## 2017-09-01 NOTE — Telephone Encounter (Signed)
Ok

## 2017-09-20 ENCOUNTER — Ambulatory Visit (INDEPENDENT_AMBULATORY_CARE_PROVIDER_SITE_OTHER): Payer: Medicaid Other

## 2017-09-20 ENCOUNTER — Ambulatory Visit (INDEPENDENT_AMBULATORY_CARE_PROVIDER_SITE_OTHER): Payer: Medicaid Other | Admitting: Obstetrics & Gynecology

## 2017-09-20 VITALS — BP 130/70 | HR 78 | Wt 276.0 lb

## 2017-09-20 DIAGNOSIS — Z3482 Encounter for supervision of other normal pregnancy, second trimester: Secondary | ICD-10-CM

## 2017-09-20 DIAGNOSIS — Z363 Encounter for antenatal screening for malformations: Secondary | ICD-10-CM

## 2017-09-20 DIAGNOSIS — O288 Other abnormal findings on antenatal screening of mother: Secondary | ICD-10-CM

## 2017-09-20 DIAGNOSIS — R8290 Unspecified abnormal findings in urine: Secondary | ICD-10-CM

## 2017-09-20 DIAGNOSIS — Z331 Pregnant state, incidental: Secondary | ICD-10-CM

## 2017-09-20 DIAGNOSIS — O09899 Supervision of other high risk pregnancies, unspecified trimester: Secondary | ICD-10-CM

## 2017-09-20 DIAGNOSIS — O09219 Supervision of pregnancy with history of pre-term labor, unspecified trimester: Secondary | ICD-10-CM

## 2017-09-20 DIAGNOSIS — Z1389 Encounter for screening for other disorder: Secondary | ICD-10-CM

## 2017-09-20 DIAGNOSIS — N3000 Acute cystitis without hematuria: Secondary | ICD-10-CM

## 2017-09-20 DIAGNOSIS — Z3A18 18 weeks gestation of pregnancy: Secondary | ICD-10-CM

## 2017-09-20 DIAGNOSIS — O09299 Supervision of pregnancy with other poor reproductive or obstetric history, unspecified trimester: Secondary | ICD-10-CM

## 2017-09-20 LAB — POCT URINALYSIS DIPSTICK
Glucose, UA: NEGATIVE
Ketones, UA: NEGATIVE
Nitrite, UA: POSITIVE
Protein, UA: NEGATIVE

## 2017-09-20 MED ORDER — CEPHALEXIN 500 MG PO CAPS: 500.0000 mg | ORAL_CAPSULE | Freq: Four times a day (QID) | ORAL | 0 refills | Status: AC

## 2017-09-20 NOTE — Progress Notes (Signed)
[id number] [redacted]w[redacted]d Estimated Date of Delivery: 02/15/18  Blood pressure 130/70, pulse 78, weight 276 lb (125.2 kg), last menstrual period 06/11/2017, unknown if currently breastfeeding.   BP weight and urine results all reviewed and noted.  Please refer to the obstetrical flow sheet for the fundal height and fetal heart rate documentation:  Patient reports good fetal movement, denies any bleeding and no rupture of membranes symptoms or regular contractions. Patient is without complaints. All questions were answered.  Orders Placed This Encounter  Procedures  . Urine Culture  . POCT Urinalysis Dipstick    Plan:  Continued routine obstetrical care, sonogram is normal with subchorionic hemorrhage present, no bleeding  +nitrates: cefazolin 500 QID x 7 days  Return in about 4 weeks (around 10/18/2017) for LROB.

## 2017-09-20 NOTE — Progress Notes (Signed)
[place] 10+3 wks,cephalic,post pl gr 0,subchorionic hemorrhage 6.2 x 3.7 x 1 cm,cx 4.6 cm,normal ovaries bilat,svp of fluid 4.5 cm,fhr 132 bpm,efw 285 g,anatomy complete

## 2017-09-22 LAB — URINE CULTURE

## 2017-10-18 ENCOUNTER — Ambulatory Visit (INDEPENDENT_AMBULATORY_CARE_PROVIDER_SITE_OTHER): Payer: Medicaid Other | Admitting: Women's Health

## 2017-10-18 ENCOUNTER — Encounter: Payer: Self-pay | Admitting: Women's Health

## 2017-10-18 VITALS — BP 120/70 | HR 98 | Temp 98.1°F | Wt 284.0 lb

## 2017-10-18 DIAGNOSIS — Z3A22 22 weeks gestation of pregnancy: Secondary | ICD-10-CM

## 2017-10-18 DIAGNOSIS — Z3482 Encounter for supervision of other normal pregnancy, second trimester: Secondary | ICD-10-CM

## 2017-10-18 DIAGNOSIS — O2342 Unspecified infection of urinary tract in pregnancy, second trimester: Secondary | ICD-10-CM

## 2017-10-18 DIAGNOSIS — O2340 Unspecified infection of urinary tract in pregnancy, unspecified trimester: Secondary | ICD-10-CM | POA: Insufficient documentation

## 2017-10-18 DIAGNOSIS — Z1389 Encounter for screening for other disorder: Secondary | ICD-10-CM

## 2017-10-18 DIAGNOSIS — Z331 Pregnant state, incidental: Secondary | ICD-10-CM

## 2017-10-18 LAB — POCT URINALYSIS DIPSTICK
Glucose, UA: NEGATIVE
Ketones, UA: NEGATIVE
Protein, UA: NEGATIVE

## 2017-10-18 MED ORDER — NITROFURANTOIN MONOHYD MACRO 100 MG PO CAPS: 100.0000 mg | ORAL_CAPSULE | Freq: Two times a day (BID) | ORAL | 0 refills | Status: DC

## 2017-10-18 NOTE — Progress Notes (Signed)
   LOW-RISK PREGNANCY VISIT Patient name: Madison Daugherty MRN [id number]  Date of birth: Dec 03, 1996 Chief Complaint:   Routine Prenatal Visit  History of Present Illness:   Madison Daugherty is a 21 y.o. G71P0101 female at [redacted]w[redacted]d with an Estimated Date of Delivery: 02/15/18 being seen today for ongoing management of a low-risk pregnancy.  Today she reports just feeling bad. Feels like she might be getting sick, but no symptoms right now. Denies fever/chills, sore throat, body aches, etc. Does have some low back pain.  Denies flank pain. Also reports some nausea and urinary frequency/hesitancy. No burning/dysuria. Lives in [place] now, thinking about transferring prenatal care to be close to home.  Movement: Present. denies leaking of fluid. Review of Systems:   Pertinent items are noted in HPI Denies abnormal vaginal discharge w/ itching/odor/irritation, headaches, visual changes, shortness of breath, chest pain, abdominal pain, severe nausea/vomiting, or problems with bowel movements unless otherwise stated above. Pertinent History Reviewed:  Reviewed past medical,surgical, social, obstetrical and family history.  Reviewed problem list, medications and allergies. Physical Assessment:   Vitals:   10/18/17 1028  BP: 120/70  Pulse: 98  Temp: 98.1 F (36.7 C)  Weight: 284 lb (128.8 kg)  Body mass index is 40.75 kg/m.        Physical Examination:   General appearance: Well appearing, and in no distress  Mental status: Alert, oriented to person, place, and time  Skin: Warm & dry  Cardiovascular: Normal heart rate noted  Respiratory: Normal respiratory effort, no distress  Abdomen: Soft, gravid, nontender  Back: no CVAT  Pelvic: Cervical exam deferred         Extremities: Edema: Trace  Fetal Status: Fetal Heart Rate (bpm): 147 Fundal Height: 24 cm Movement: Present    Results for orders placed or performed in visit on 10/18/17 (from the past 24 hour(s))  POCT urinalysis dipstick   Collection Time: 10/18/17 10:32 AM  Result Value Ref Range   Color, UA     Clarity, UA     Glucose, UA neg    Bilirubin, UA     Ketones, UA neg    Spec Grav, UA  1.010 - 1.025   Blood, UA small    pH, UA  5.0 - 8.0   Protein, UA neg    Urobilinogen, UA  0.2 or 1.0 E.U./dL   Nitrite, UA postive    Leukocytes, UA Small (1+) (A) Negative    Assessment & Plan:  1) Low-risk pregnancy G2P0101 at [redacted]w[redacted]d with an Estimated Date of Delivery: 02/15/18   2) UTI, rx macrobid, send urine cx, push po fluids. Discussed pyelo s/s, reasons to seek care  3) Feels bad/nonspecific> no sx, afebrile, no evidence of pyelo, gave printed list of URI relief measures just in case  4) Low back pain> gave printed relief measures    Labs/procedures today: none  Plan:  Continue routine obstetrical care   Reviewed: Preterm labor symptoms and general obstetric precautions including but not limited to vaginal bleeding, contractions, leaking of fluid and fetal movement were reviewed in detail with the patient.  All questions were answered  Follow-up: Return for LROB, PN2.  Orders Placed This Encounter  Procedures  . Urine Culture  . POCT urinalysis dipstick   Tawnya Crook CNM, [facility] 10/18/2017 11:14 AM

## 2017-10-18 NOTE — Patient Instructions (Addendum)
Madison Daugherty, I greatly value your feedback.  If you receive a survey following your visit with [place] today, we appreciate you taking the time to fill it out.  Thanks, Knute Neu, CNM, WHNP-BC   You will have your sugar test next visit.  Please do not eat or drink anything after midnight the night before you come, not even water.  You will be here for at least two hours.     Call the office [phone]) or go to [facility] if:  You begin to have strong, frequent contractions  Your water breaks.  Sometimes it is a big gush of fluid, sometimes it is just a trickle that keeps getting your panties wet or running down your legs  You have vaginal bleeding.  It is normal to have a small amount of spotting if your cervix was checked.   You don't feel your baby moving like normal.  If you don't, get you something to eat and drink and lay down and focus on feeling your baby move.   If your baby is still not moving like normal, you should call the office or go to [place] and saline nasal spray for nasal congestion  Regular robitussin, cough drops for cough  Warm salt water gargles for sore throat  Mucinex with lots of water to help you cough up the mucous in your chest if needed  Drink plenty of fluids and stay hydrated!  Wash your hands frequently.  Call if you are not improving by 7-10 days.  For your lower back pain you may:  Purchase a pregnancy belt from [employer] [place], [employer], [employer], etc and wear it while you are up and about  Take warm baths  Use a heating pad to your lower back for no longer than 20 minutes at a time, and do not place near abdomen  Take tylenol as needed. Please follow directions on the bottle  Kinesthesiology tape (can get from sporting goods store), google how to tape belly for pregnancy      Second Trimester of Pregnancy The second trimester is from week 13 through week 28, months 4 through 6. The  second trimester is often a time when you feel your best. Your body has also adjusted to being pregnant, and you begin to feel better physically. Usually, morning sickness has lessened or quit completely, you may have more energy, and you may have an increase in appetite. The second trimester is also a time when the fetus is growing rapidly. At the end of the sixth month, the fetus is about 9 inches long and weighs about 1 pounds. You will likely begin to feel the baby move (quickening) between 18 and 20 weeks of the pregnancy. BODY CHANGES Your body goes through many changes during pregnancy. The changes vary from woman to woman.   Your weight will continue to increase. You will notice your lower abdomen bulging out.  You may begin to get stretch marks on your hips, abdomen, and breasts.  You may develop headaches that can be relieved by medicines approved by your health care provider.  You may urinate more often because the fetus is pressing on your bladder.  You may develop or continue to have heartburn as a result of your pregnancy.  You may develop constipation because certain hormones are causing the muscles that push waste through your intestines to slow down.  You may develop hemorrhoids or swollen, bulging veins (varicose veins).  You may have back pain because of the weight gain and pregnancy hormones relaxing your joints between the bones in your pelvis and as a result of a shift in weight and the muscles that support your balance.  Your breasts will continue to grow and be tender.  Your gums may bleed and may be sensitive to brushing and flossing.  Dark spots or blotches (chloasma, mask of pregnancy) may develop on your face. This will likely fade after the baby is born.  A dark line from your belly button to the pubic area (linea nigra) may appear. This will likely fade after the baby is born.  You may have changes in your hair. These can include thickening of your hair,  rapid growth, and changes in texture. Some women also have hair loss during or after pregnancy, or hair that feels dry or thin. Your hair will most likely return to normal after your baby is born. WHAT TO EXPECT AT YOUR PRENATAL VISITS During a routine prenatal visit:  You will be weighed to make sure you and the fetus are growing normally.  Your blood pressure will be taken.  Your abdomen will be measured to track your baby's growth.  The fetal heartbeat will be listened to.  Any test results from the previous visit will be discussed. Your health care provider may ask you:  How you are feeling.  If you are feeling the baby move.  If you have had any abnormal symptoms, such as leaking fluid, bleeding, severe headaches, or abdominal cramping.  If you have any questions. Other tests that may be performed during your second trimester include:  Blood tests that check for:  Low iron levels (anemia).  Gestational diabetes (between 24 and 28 weeks).  Rh antibodies.  Urine tests to check for infections, diabetes, or protein in the urine.  An ultrasound to confirm the proper growth and development of the baby.  An amniocentesis to check for possible genetic problems.  Fetal screens for spina bifida and Down syndrome. HOME CARE INSTRUCTIONS   Avoid all smoking, herbs, alcohol, and unprescribed drugs. These chemicals affect the formation and growth of the baby.  Follow your health care provider's instructions regarding medicine use. There are medicines that are either safe or unsafe to take during pregnancy.  Exercise only as directed by your health care provider. Experiencing uterine cramps is a good sign to stop exercising.  Continue to eat regular, healthy meals.  Wear a good support bra for breast tenderness.  Do not use hot tubs, steam rooms, or saunas.  Wear your seat belt at all times when driving.  Avoid raw meat, uncooked cheese, cat litter boxes, and soil used by  cats. These carry germs that can cause birth defects in the baby.  Take your prenatal vitamins.  Try taking a stool softener (if your health care provider approves) if you develop constipation. Eat more high-fiber foods, such as fresh vegetables or fruit and whole grains. Drink plenty of fluids to keep your urine clear or pale yellow.  Take warm sitz baths to soothe any pain or discomfort caused by hemorrhoids. Use hemorrhoid cream if your health care provider approves.  If you develop varicose veins, wear support hose. Elevate your feet for 15 minutes, 3-4 times a day. Limit salt in your diet.  Avoid heavy lifting, wear low heel shoes, and practice good posture.  Rest with your legs elevated if you have leg cramps or low back pain.  Visit your dentist if you  have not gone yet during your pregnancy. Use a soft toothbrush to brush your teeth and be gentle when you floss.  A sexual relationship may be continued unless your health care provider directs you otherwise.  Continue to go to all your prenatal visits as directed by your health care provider. SEEK MEDICAL CARE IF:   You have dizziness.  You have mild pelvic cramps, pelvic pressure, or nagging pain in the abdominal area.  You have persistent nausea, vomiting, or diarrhea.  You have a bad smelling vaginal discharge.  You have pain with urination. SEEK IMMEDIATE MEDICAL CARE IF:   You have a fever.  You are leaking fluid from your vagina.  You have spotting or bleeding from your vagina.  You have severe abdominal cramping or pain.  You have rapid weight gain or loss.  You have shortness of breath with chest pain.  You notice sudden or extreme swelling of your face, hands, ankles, feet, or legs.  You have not felt your baby move in over an hour.  You have severe headaches that do not go away with medicine.  You have vision changes. Document Released: 11/30/2001 Document Revised: 12/11/2013 Document Reviewed:  02/06/2013 [facility] Patient Information 2015 [place], [place]. This information is not intended to replace advice given to you by your health care provider. Make sure you discuss any questions you have with your health care provider.

## 2017-10-19 ENCOUNTER — Telehealth: Payer: Self-pay | Admitting: Women's Health

## 2017-10-19 ENCOUNTER — Encounter: Payer: Self-pay | Admitting: *Deleted

## 2017-10-19 NOTE — Telephone Encounter (Signed)
Pt called stating that she was seen in our office for this week for a UTI and because she had a bad UTI she did not attend work on Friday or Monday. Pt would like to know if we could write her a note excusing her from work those two days. Please contact pt when note is ready for pick up

## 2017-10-20 LAB — URINE CULTURE

## 2017-10-21 ENCOUNTER — Other Ambulatory Visit: Payer: Self-pay | Admitting: Women's Health

## 2017-10-21 DIAGNOSIS — O2342 Unspecified infection of urinary tract in pregnancy, second trimester: Secondary | ICD-10-CM

## 2017-10-21 MED ORDER — NITROFURANTOIN MONOHYD MACRO 100 MG PO CAPS: 100.0000 mg | ORAL_CAPSULE | Freq: Every day | ORAL | 6 refills | Status: AC

## 2017-10-21 NOTE — Telephone Encounter (Signed)
LMVOM to return call.

## 2017-10-24 ENCOUNTER — Telehealth: Payer: Self-pay | Admitting: Obstetrics & Gynecology

## 2017-10-24 ENCOUNTER — Encounter: Payer: Self-pay | Admitting: *Deleted

## 2017-10-24 NOTE — Telephone Encounter (Signed)
Patient called stating that she has been throwing up since yesterday and she has not been able to hold anything down. Pt would like to know what to do. Please contact pt

## 2017-10-24 NOTE — Telephone Encounter (Signed)
LMOVM returning call 

## 2017-10-24 NOTE — Telephone Encounter (Signed)
Attempted to call patient but person answering phone stated it was the wrong number. Will send mychart message.

## 2017-10-25 ENCOUNTER — Telehealth: Payer: Self-pay | Admitting: *Deleted

## 2017-10-25 NOTE — Telephone Encounter (Signed)
LMOVM requesting return call.

## 2017-11-02 ENCOUNTER — Ambulatory Visit (INDEPENDENT_AMBULATORY_CARE_PROVIDER_SITE_OTHER): Payer: Medicaid Other | Admitting: Advanced Practice Midwife

## 2017-11-02 ENCOUNTER — Encounter: Payer: Self-pay | Admitting: Advanced Practice Midwife

## 2017-11-02 VITALS — BP 128/78 | HR 110 | Wt 283.0 lb

## 2017-11-02 DIAGNOSIS — Z331 Pregnant state, incidental: Secondary | ICD-10-CM | POA: Diagnosis not present

## 2017-11-02 DIAGNOSIS — O368120 Decreased fetal movements, second trimester, not applicable or unspecified: Secondary | ICD-10-CM

## 2017-11-02 DIAGNOSIS — Z1389 Encounter for screening for other disorder: Secondary | ICD-10-CM

## 2017-11-02 DIAGNOSIS — Z3482 Encounter for supervision of other normal pregnancy, second trimester: Secondary | ICD-10-CM

## 2017-11-02 DIAGNOSIS — Z3A25 25 weeks gestation of pregnancy: Secondary | ICD-10-CM

## 2017-11-02 LAB — POCT URINALYSIS DIPSTICK
Blood, UA: NEGATIVE
Glucose, UA: NEGATIVE
Ketones, UA: NEGATIVE
Nitrite, UA: NEGATIVE
Protein, UA: NEGATIVE
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
## 2017-11-07 ENCOUNTER — Telehealth: Payer: Self-pay | Admitting: *Deleted

## 2017-11-07 NOTE — Telephone Encounter (Signed)
LMOVM to return call regarding note.
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
## 2017-11-15 ENCOUNTER — Ambulatory Visit (INDEPENDENT_AMBULATORY_CARE_PROVIDER_SITE_OTHER): Payer: Medicaid Other | Admitting: Women's Health

## 2017-11-15 ENCOUNTER — Other Ambulatory Visit: Payer: Medicaid Other

## 2017-11-15 ENCOUNTER — Encounter: Payer: Self-pay | Admitting: Women's Health

## 2017-11-15 VITALS — BP 120/60 | HR 92 | Wt 280.0 lb

## 2017-11-15 DIAGNOSIS — Z3A26 26 weeks gestation of pregnancy: Secondary | ICD-10-CM

## 2017-11-15 DIAGNOSIS — Z131 Encounter for screening for diabetes mellitus: Secondary | ICD-10-CM

## 2017-11-15 DIAGNOSIS — O2342 Unspecified infection of urinary tract in pregnancy, second trimester: Secondary | ICD-10-CM

## 2017-11-15 DIAGNOSIS — Z3482 Encounter for supervision of other normal pregnancy, second trimester: Secondary | ICD-10-CM

## 2017-11-15 DIAGNOSIS — Z331 Pregnant state, incidental: Secondary | ICD-10-CM

## 2017-11-15 DIAGNOSIS — Z1389 Encounter for screening for other disorder: Secondary | ICD-10-CM

## 2017-11-15 LAB — POCT URINALYSIS DIPSTICK
Blood, UA: NEGATIVE
Glucose, UA: NEGATIVE
Ketones, UA: NEGATIVE
Leukocytes, UA: NEGATIVE
Nitrite, UA: NEGATIVE
Protein, UA: NEGATIVE

## 2017-11-15 NOTE — Patient Instructions (Addendum)
Madison Daugherty, I greatly value your feedback.  If you receive a survey following your visit with [place] today, we appreciate you taking the time to fill it out.  Thanks, Knute Neu, CNM, WHNP-BC   Call the office [phone]) or go to [facility] if:  You begin to have strong, frequent contractions  Your water breaks.  Sometimes it is a big gush of fluid, sometimes it is just a trickle that keeps getting your panties wet or running down your legs  You have vaginal bleeding.  It is normal to have a small amount of spotting if your cervix was checked.   You don't feel your baby moving like normal.  If you don't, get you something to eat and drink and lay down and focus on feeling your baby move.  You should feel at least 10 movements in 2 hours.  If you don't, you should call the office or go to [facility].    Tdap Vaccine  It is recommended that you get the Tdap vaccine during the third trimester of EACH pregnancy to help protect your baby from getting pertussis (whooping cough)  27-36 weeks is the BEST time to do this so that you can pass the protection on to your baby. During pregnancy is better than after pregnancy, but if you are unable to get it during pregnancy it will be offered at the hospital.   You can get this vaccine at the health department or your family doctor  Everyone who will be around your baby should also be up-to-date on their vaccines. Adults (who are not pregnant) only need 1 dose of Tdap during adulthood.   Third Trimester of Pregnancy The third trimester is from week 29 through week 42, months 7 through 9. The third trimester is a time when the fetus is growing rapidly. At the end of the ninth month, the fetus is about 20 inches in length and weighs 6-10 pounds.  BODY CHANGES Your body goes through many changes during pregnancy. The changes vary from woman to woman.   Your weight will continue to increase. You can expect to gain 25-35 pounds (11-16 kg) by  the end of the pregnancy.  You may begin to get stretch marks on your hips, abdomen, and breasts.  You may urinate more often because the fetus is moving lower into your pelvis and pressing on your bladder.  You may develop or continue to have heartburn as a result of your pregnancy.  You may develop constipation because certain hormones are causing the muscles that push waste through your intestines to slow down.  You may develop hemorrhoids or swollen, bulging veins (varicose veins).  You may have pelvic pain because of the weight gain and pregnancy hormones relaxing your joints between the bones in your pelvis. Backaches may result from overexertion of the muscles supporting your posture.  You may have changes in your hair. These can include thickening of your hair, rapid growth, and changes in texture. Some women also have hair loss during or after pregnancy, or hair that feels dry or thin. Your hair will most likely return to normal after your baby is born.  Your breasts will continue to grow and be tender. A yellow discharge may leak from your breasts called colostrum.  Your belly button may stick out.  You may feel short of breath because of your expanding uterus.  You may notice the fetus "dropping," or moving lower in your abdomen.  You may have a bloody  mucus discharge. This usually occurs a few days to a week before labor begins.  Your cervix becomes thin and soft (effaced) near your due date. WHAT TO EXPECT AT YOUR PRENATAL EXAMS  You will have prenatal exams every 2 weeks until week 36. Then, you will have weekly prenatal exams. During a routine prenatal visit:  You will be weighed to make sure you and the fetus are growing normally.  Your blood pressure is taken.  Your abdomen will be measured to track your baby's growth.  The fetal heartbeat will be listened to.  Any test results from the previous visit will be discussed.  You may have a cervical check near your  due date to see if you have effaced. At around 36 weeks, your caregiver will check your cervix. At the same time, your caregiver will also perform a test on the secretions of the vaginal tissue. This test is to determine if a type of bacteria, Group B streptococcus, is present. Your caregiver will explain this further. Your caregiver may ask you:  What your birth plan is.  How you are feeling.  If you are feeling the baby move.  If you have had any abnormal symptoms, such as leaking fluid, bleeding, severe headaches, or abdominal cramping.  If you have any questions. Other tests or screenings that may be performed during your third trimester include:  Blood tests that check for low iron levels (anemia).  Fetal testing to check the health, activity level, and growth of the fetus. Testing is done if you have certain medical conditions or if there are problems during the pregnancy. FALSE LABOR You may feel small, irregular contractions that eventually go away. These are called Braxton Hicks contractions, or false labor. Contractions may last for hours, days, or even weeks before true labor sets in. If contractions come at regular intervals, intensify, or become painful, it is best to be seen by your caregiver.  SIGNS OF LABOR   Menstrual-like cramps.  Contractions that are 5 minutes apart or less.  Contractions that start on the top of the uterus and spread down to the lower abdomen and back.  A sense of increased pelvic pressure or back pain.  A watery or bloody mucus discharge that comes from the vagina. If you have any of these signs before the 37th week of pregnancy, call your caregiver right away. You need to go to the hospital to get checked immediately. HOME CARE INSTRUCTIONS   Avoid all smoking, herbs, alcohol, and unprescribed drugs. These chemicals affect the formation and growth of the baby.  Follow your caregiver's instructions regarding medicine use. There are medicines  that are either safe or unsafe to take during pregnancy.  Exercise only as directed by your caregiver. Experiencing uterine cramps is a good sign to stop exercising.  Continue to eat regular, healthy meals.  Wear a good support bra for breast tenderness.  Do not use hot tubs, steam rooms, or saunas.  Wear your seat belt at all times when driving.  Avoid raw meat, uncooked cheese, cat litter boxes, and soil used by cats. These carry germs that can cause birth defects in the baby.  Take your prenatal vitamins.  Try taking a stool softener (if your caregiver approves) if you develop constipation. Eat more high-fiber foods, such as fresh vegetables or fruit and whole grains. Drink plenty of fluids to keep your urine clear or pale yellow.  Take warm sitz baths to soothe any pain or discomfort caused by hemorrhoids.  Use hemorrhoid cream if your caregiver approves.  If you develop varicose veins, wear support hose. Elevate your feet for 15 minutes, 3-4 times a day. Limit salt in your diet.  Avoid heavy lifting, wear low heal shoes, and practice good posture.  Rest a lot with your legs elevated if you have leg cramps or low back pain.  Visit your dentist if you have not gone during your pregnancy. Use a soft toothbrush to brush your teeth and be gentle when you floss.  A sexual relationship may be continued unless your caregiver directs you otherwise.  Do not travel far distances unless it is absolutely necessary and only with the approval of your caregiver.  Take prenatal classes to understand, practice, and ask questions about the labor and delivery.  Make a trial run to the hospital.  Pack your hospital bag.  Prepare the baby's nursery.  Continue to go to all your prenatal visits as directed by your caregiver. SEEK MEDICAL CARE IF:  You are unsure if you are in labor or if your water has broken.  You have dizziness.  You have mild pelvic cramps, pelvic pressure, or nagging  pain in your abdominal area.  You have persistent nausea, vomiting, or diarrhea.  You have a bad smelling vaginal discharge.  You have pain with urination. SEEK IMMEDIATE MEDICAL CARE IF:   You have a fever.  You are leaking fluid from your vagina.  You have spotting or bleeding from your vagina.  You have severe abdominal cramping or pain.  You have rapid weight loss or gain.  You have shortness of breath with chest pain.  You notice sudden or extreme swelling of your face, hands, ankles, feet, or legs.  You have not felt your baby move in over an hour.  You have severe headaches that do not go away with medicine.  You have vision changes. Document Released: 11/30/2001 Document Revised: 12/11/2013 Document Reviewed: 02/06/2013 [facility] Patient Information 2015 [place], [place]. This information is not intended to replace advice given to you by your health care provider. Make sure you discuss any questions you have with your health care provider.   PROTECT YOURSELF & YOUR BABY FROM THE FLU! Because you are pregnant, we at [facility], along with the Centers for Disease Control (CDC), recommend that you receive the flu vaccine to protect yourself and your baby from the flu. The flu is more likely to cause severe illness in pregnant women than in women of reproductive age who are not pregnant. Changes in the immune system, heart, and lungs during pregnancy make pregnant women (and women up to two weeks postpartum) more prone to severe illness from flu, including illness resulting in hospitalization. Flu also may be harmful for a pregnant woman's developing baby. A common flu symptom is fever, which may be associated with neural tube defects and other adverse outcomes for a developing baby. Getting vaccinated can also help protect a baby after birth from flu. (Mom passes antibodies onto the developing baby during her pregnancy.)  A Flu Vaccine is the Best Protection Against Flu Getting  a flu vaccine is the first and most important step in protecting against flu. Pregnant women should get a flu shot and not the live attenuated influenza vaccine (LAIV), also known as nasal spray flu vaccine. Flu vaccines given during pregnancy help protect both the mother and her baby from flu. Vaccination has been shown to reduce the risk of flu-associated acute respiratory infection in pregnant women by up to  one-half. A 2018 study showed that getting a flu shot reduced a pregnant woman's risk of being hospitalized with flu by an average of 40 percent. Pregnant women who get a flu vaccine are also helping to protect their babies from flu illness for the first several months after their birth, when they are too young to get vaccinated.   A Long Record of Safety for Flu Shots in Pregnant Women Flu shots have been given to millions of pregnant women over many years with a good safety record. There is a lot of evidence that flu vaccines can be given safely during pregnancy; though these data are limited for the first trimester. The CDC recommends that pregnant women get vaccinated during any trimester of their pregnancy. It is very important for pregnant women to get the flu shot.   Other Preventive Actions In addition to getting a flu shot, pregnant women should take the same everyday preventive actions the CDC recommends of everyone, including covering coughs, washing hands often, and avoiding people who are sick.  Symptoms and Treatment If you get sick with flu symptoms call your doctor right away. There are antiviral drugs that can treat flu illness and prevent serious flu complications. The CDC recommends prompt treatment for people who have influenza infection or suspected influenza infection and who are at high risk of serious flu complications, such as people with asthma, diabetes (including gestational diabetes), or heart disease. Early treatment of influenza in hospitalized pregnant women has been  shown to reduce the length of the hospital stay.  Symptoms Flu symptoms include fever, cough, sore throat, runny or stuffy nose, body aches, headache, chills and fatigue. Some people may also have vomiting and diarrhea. People may be infected with the flu and have respiratory symptoms without a fever.  Early Treatment is Important for Pregnant Women Treatment should begin as soon as possible because antiviral drugs work best when started early (within 48 hours after symptoms start). Antiviral drugs can make your flu illness milder and make you feel better faster. They may also prevent serious health problems that can result from flu illness. Oral oseltamivir (Tamiflu) is the preferred treatment for pregnant women because it has the most studies available to suggest that it is safe and beneficial. Antiviral drugs require a prescription from your provider. Having a fever caused by flu infection or other infections early in pregnancy may be linked to birth defects in a baby. In addition to taking antiviral drugs, pregnant women who get a fever should treat their fever with Tylenol (acetaminophen) and contact their provider immediately.  When to [place] If you are pregnant and have any of these signs, seek care immediately:  Difficulty breathing or shortness of breath  Pain or pressure in the chest or abdomen  Sudden dizziness  Confusion  Severe or persistent vomiting  High fever that is not responding to Tylenol (or store brand equivalent)  Decreased or no movement of your baby  [email or web address]

## 2017-11-15 NOTE — Progress Notes (Signed)
LOW-RISK PREGNANCY VISIT Patient name: Madison Daugherty MRN [id number]  Date of birth: January 21, 1996 Chief Complaint:   Routine Prenatal Visit  History of Present Illness:   Madison Daugherty is a 21 y.o. G6P0101 female at [redacted]w[redacted]d with an Estimated Date of Delivery: 02/15/18 being seen today for ongoing management of a low-risk pregnancy.  Today she reports no complaints. Has had persistent uti during pregnancy- treated w/ macrobid at last visit then was supposed to start nightly suppression- states she was unaware and only did the 7d course. Denies any current sx.  Contractions: Not present. Vag. Bleeding: None.  Movement: Present. denies leaking of fluid. Review of Systems:   Pertinent items are noted in HPI Denies abnormal vaginal discharge w/ itching/odor/irritation, headaches, visual changes, shortness of breath, chest pain, abdominal pain, severe nausea/vomiting, or problems with urination or bowel movements unless otherwise stated above. Pertinent History Reviewed:  Reviewed past medical,surgical, social, obstetrical and family history.  Reviewed problem list, medications and allergies. Physical Assessment:   Vitals:   11/15/17 0955  BP: 120/60  Pulse: 92  Weight: 280 lb (127 kg)  Body mass index is 40.18 kg/m.        Physical Examination:   General appearance: Well appearing, and in no distress  Mental status: Alert, oriented to person, place, and time  Skin: Warm & dry  Cardiovascular: Normal heart rate noted  Respiratory: Normal respiratory effort, no distress  Abdomen: Soft, gravid, nontender  Pelvic: Cervical exam deferred         Extremities: Edema: Trace  Fetal Status: Fetal Heart Rate (bpm): 156 Fundal Height: 26 cm Movement: Present    Results for orders placed or performed in visit on 11/15/17 (from the past 24 hour(s))  POCT Urinalysis Dipstick   Collection Time: 11/15/17  9:58 AM  Result Value Ref Range   Color, UA     Clarity, UA     Glucose, UA neg    Bilirubin,  UA     Ketones, UA neg    Spec Grav, UA  1.010 - 1.025   Blood, UA neg    pH, UA  5.0 - 8.0   Protein, UA neg    Urobilinogen, UA  0.2 or 1.0 E.U./dL   Nitrite, UA neg    Leukocytes, UA Negative Negative    Assessment & Plan:  1) Low-risk pregnancy G2P0101 at [redacted]w[redacted]d with an Estimated Date of Delivery: 02/15/18   2) Persistent UTI during pregnancy, hasn't started nightly macrobid suppression as rx'd on 11/2, to go ahead and pick up and start today, will send urine cx poc today   Labs/procedures today: pn2. Declined flu shot. She was advised that the flu shot is recommended during pregnancy to help protect her and her baby. We discussed that it is an inactivated vaccine-so side effects are minimal, it is considered safe to receive during any trimester, and pregnant women are at a higher risk of developing potential complications from the flu, including death. She was given printed information from the CDC regarding the flu shot and the flu.    Plan:  Continue routine obstetrical care   Reviewed: Preterm labor symptoms and general obstetric precautions including but not limited to vaginal bleeding, contractions, leaking of fluid and fetal movement were reviewed in detail with the patient. Recommended Tdap at HD/PCP per CDC guidelines.  All questions were answered  Follow-up: Return in about 4 weeks (around 12/13/2017) for [place].  Orders Placed This Encounter  Procedures  . Urine Culture  .  POCT Urinalysis Dipstick   Tawnya Crook CNM, [facility] 11/15/2017 10:13 AM

## 2017-11-16 ENCOUNTER — Telehealth: Payer: Self-pay | Admitting: *Deleted

## 2017-11-16 ENCOUNTER — Encounter: Payer: Self-pay | Admitting: Advanced Practice Midwife

## 2017-11-16 DIAGNOSIS — O24410 Gestational diabetes mellitus in pregnancy, diet controlled: Secondary | ICD-10-CM

## 2017-11-16 DIAGNOSIS — O24419 Gestational diabetes mellitus in pregnancy, unspecified control: Secondary | ICD-10-CM | POA: Insufficient documentation

## 2017-11-16 LAB — CBC
Hematocrit: 36 % (ref 34.0–46.6)
Hemoglobin: 11.6 g/dL (ref 11.1–15.9)
MCH: 28.1 pg (ref 26.6–33.0)
MCHC: 32.2 g/dL (ref 31.5–35.7)
MCV: 87 fL (ref 79–97)
Platelets: 272 10*3/uL (ref 150–379)
RBC: 4.13 x10E6/uL (ref 3.77–5.28)
RDW: 14.3 % (ref 12.3–15.4)
WBC: 7.6 10*3/uL (ref 3.4–10.8)

## 2017-11-16 LAB — ANTIBODY SCREEN: Antibody Screen: NEGATIVE

## 2017-11-16 LAB — GLUCOSE TOLERANCE, 2 HOURS W/ 1HR
Glucose, 1 hour: 163 mg/dL (ref 65–179)
Glucose, 2 hour: 78 mg/dL (ref 65–152)
Glucose, Fasting: 92 mg/dL — ABNORMAL HIGH (ref 65–91)

## 2017-11-16 LAB — RPR: RPR Ser Ql: NONREACTIVE

## 2017-11-16 LAB — HIV ANTIBODY (ROUTINE TESTING W REFLEX): HIV Screen 4th Generation wRfx: NONREACTIVE

## 2017-11-16 NOTE — Telephone Encounter (Signed)
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 Informed referral was sent to dietician and will need to check blood sugar 4 times daily and bring log to appointment. Informed she is not going to be on medication at this time but blood sugars will be evaluated at each visit. Advised if she did not hear from dietician in about a week, to let us know so that we can contact them directly.  Pt verbalized understanding.

## 2017-11-16 NOTE — Telephone Encounter (Signed)
A1DM referral sent to dietician and attempted to notify patient. LMOVM

## 2017-11-17 LAB — URINE CULTURE

## 2017-12-07 ENCOUNTER — Ambulatory Visit: Payer: Medicaid Other | Admitting: Registered"

## 2017-12-14 ENCOUNTER — Ambulatory Visit (INDEPENDENT_AMBULATORY_CARE_PROVIDER_SITE_OTHER): Payer: Medicaid Other | Admitting: Obstetrics & Gynecology

## 2017-12-14 ENCOUNTER — Encounter: Payer: Self-pay | Admitting: Obstetrics & Gynecology

## 2017-12-14 VITALS — BP 110/60 | HR 92 | Wt 295.0 lb

## 2017-12-14 DIAGNOSIS — Z1389 Encounter for screening for other disorder: Secondary | ICD-10-CM

## 2017-12-14 DIAGNOSIS — Z3483 Encounter for supervision of other normal pregnancy, third trimester: Secondary | ICD-10-CM

## 2017-12-14 DIAGNOSIS — Z3A31 31 weeks gestation of pregnancy: Secondary | ICD-10-CM

## 2017-12-14 DIAGNOSIS — Z331 Pregnant state, incidental: Secondary | ICD-10-CM

## 2017-12-14 LAB — POCT URINALYSIS DIPSTICK
Blood, UA: NEGATIVE
Glucose, UA: NEGATIVE
Ketones, UA: NEGATIVE
Leukocytes, UA: NEGATIVE
Nitrite, UA: NEGATIVE
Protein, UA: NEGATIVE

## 2017-12-14 NOTE — Progress Notes (Signed)
[id number] 102w0d Estimated Date of Delivery: 02/15/18  Blood pressure 110/60, pulse 92, weight 295 lb (133.8 kg), last menstrual period 06/11/2017, unknown if currently breastfeeding.   BP weight and urine results all reviewed and noted.  Please refer to the obstetrical flow sheet for the fundal height and fetal heart rate documentation:  Patient reports good fetal movement, denies any bleeding and no rupture of membranes symptoms or regular contractions. Patient is without complaints. All questions were answered.  Orders Placed This Encounter  Procedures  . POCT urinalysis dipstick    Plan:  Continued routine obstetrical care, pt missed her diabetic educator appointment and I have encouraged her to reschedule her appointment She still is considering transfer care to practice in [place] due to location  Return in about 2 weeks (around 12/28/2017) for [place], with Dr Elonda Husky.

## 2017-12-19 ENCOUNTER — Encounter: Payer: Self-pay | Admitting: Registered"

## 2017-12-21 ENCOUNTER — Encounter: Payer: Self-pay | Admitting: Registered"

## 2017-12-21 ENCOUNTER — Encounter: Payer: Medicaid Other | Attending: Advanced Practice Midwife | Admitting: Registered"

## 2017-12-21 DIAGNOSIS — R7309 Other abnormal glucose: Secondary | ICD-10-CM | POA: Insufficient documentation

## 2017-12-21 DIAGNOSIS — O24419 Gestational diabetes mellitus in pregnancy, unspecified control: Secondary | ICD-10-CM | POA: Insufficient documentation

## 2017-12-21 DIAGNOSIS — Z713 Dietary counseling and surveillance: Secondary | ICD-10-CM | POA: Diagnosis not present

## 2017-12-21 NOTE — Progress Notes (Signed)
Patient was seen on 12/21/17 for Gestational Diabetes self-management class at the [facility]. The following learning objectives were met by the patient during this course:   States the definition of Gestational Diabetes  States why dietary management is important in controlling blood glucose  Describes the effects each nutrient has on blood glucose levels  Demonstrates ability to create a balanced meal plan  Demonstrates carbohydrate counting   States when to check blood glucose levels  Demonstrates proper blood glucose monitoring techniques  States the effect of stress and exercise on blood glucose levels  States the importance of limiting caffeine and abstaining from alcohol and smoking  Blood glucose monitor given: Accu-Chek Guide Lot # [id number] Exp: 02-12-2019 Blood glucose reading: 80  Patient instructed to monitor glucose levels: FBS: 60 - <95 1 hour: <140 2 hour: <120  Patient received handouts:  Nutrition Diabetes and Pregnancy  Carbohydrate Counting List  Patient will be seen for follow-up as needed.

## 2017-12-29 ENCOUNTER — Encounter: Payer: Medicaid Other | Admitting: Obstetrics & Gynecology

## 2018-05-10 ENCOUNTER — Encounter (HOSPITAL_COMMUNITY): Payer: Self-pay

## 2019-03-21 DEATH — deceased
